# Patient Record
Sex: Male | Born: 1955 | Race: Black or African American | Hispanic: No | Marital: Single | Smoking: Never smoker
Health system: Southern US, Community
[De-identification: ages and names within clinical notes are randomized; demographics above are authoritative.]

## PROBLEM LIST (undated history)

## (undated) DIAGNOSIS — J309 Allergic rhinitis, unspecified: Secondary | ICD-10-CM

## (undated) HISTORY — PX: BACK SURGERY: SHX140

---

## 2015-10-01 ENCOUNTER — Ambulatory Visit
Admission: EM | Admit: 2015-10-01 | Discharge: 2015-10-01 | Disposition: A | Discharge status: Home / Self Care | Payer: Medicaid Other | Attending: Family Medicine | Admitting: Family Medicine

## 2015-10-01 ENCOUNTER — Ambulatory Visit: Payer: Medicaid Other

## 2015-10-01 DIAGNOSIS — S161XXA Strain of muscle, fascia and tendon at neck level, initial encounter: Secondary | ICD-10-CM

## 2015-10-01 DIAGNOSIS — Z79899 Other long term (current) drug therapy: Secondary | ICD-10-CM | POA: Insufficient documentation

## 2015-10-01 DIAGNOSIS — J309 Allergic rhinitis, unspecified: Secondary | ICD-10-CM | POA: Diagnosis not present

## 2015-10-01 DIAGNOSIS — M50322 Other cervical disc degeneration at C5-C6 level: Secondary | ICD-10-CM | POA: Diagnosis not present

## 2015-10-01 DIAGNOSIS — R202 Paresthesia of skin: Secondary | ICD-10-CM | POA: Diagnosis not present

## 2015-10-01 DIAGNOSIS — R200 Anesthesia of skin: Secondary | ICD-10-CM | POA: Insufficient documentation

## 2015-10-01 DIAGNOSIS — Z9889 Other specified postprocedural states: Secondary | ICD-10-CM | POA: Insufficient documentation

## 2015-10-01 HISTORY — DX: Allergic rhinitis, unspecified: J30.9

## 2015-10-01 MED ORDER — ORPHENADRINE CITRATE ER 100 MG PO TB12: 100.0000 mg | ORAL_TABLET | Freq: Two times a day (BID) | ORAL | 0 refills | Status: DC

## 2015-10-01 MED ORDER — MELOXICAM 15 MG PO TABS: 15.0000 mg | ORAL_TABLET | Freq: Every day | ORAL | 0 refills | Status: DC
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
## 2016-08-21 ENCOUNTER — Ambulatory Visit
Admission: EM | Admit: 2016-08-21 | Discharge: 2016-08-21 | Disposition: A | Discharge status: Home / Self Care | Payer: Medicaid Other | Attending: Emergency Medicine | Admitting: Emergency Medicine

## 2016-08-21 DIAGNOSIS — H6123 Impacted cerumen, bilateral: Secondary | ICD-10-CM

## 2016-08-21 MED ORDER — FLUTICASONE PROPIONATE 50 MCG/ACT NA SUSP: 2.0000 | Freq: Every day | NASAL | 0 refills | Status: AC

## 2016-08-21 NOTE — ED Provider Notes (Signed)
HPI  SUBJECTIVE:  Barry Parker is a 61 y.o. male who presents with diminished hearing in both of his ears for the past week. He states that he has been using Q-tips to clean his ears out. He tried peroxide without improvement in symptoms. No aggravating or alleviating factors. No pain, otorrhea, fevers, URI symptoms. He states that he feels that his nose feels congested but has not been using his Flonase. No sinus pain or pressure, ear pain, sore throat, postnasal drip, rhinorrhea. No allergy type symptoms. No recent swimming. has a history of "back problems". No history of diabetes. PMD: Dr. Redding:Medicine, [employer]   Past Medical History:  Diagnosis Date  . Allergic rhinitis     Past Surgical History:  Procedure Laterality Date  . BACK SURGERY      History reviewed. No pertinent family history.  Social History  Substance Use Topics  . Smoking status: Never Smoker  . Smokeless tobacco: Never Used  . Alcohol use Yes     Comment: social    No current facility-administered medications for this encounter.   Current Outpatient Prescriptions:  .  fluticasone (FLONASE) 50 MCG/ACT nasal spray, Place 2 sprays into both nostrils daily., Disp: 16 g, Rfl: 0  No Known Allergies   ROS  As noted in HPI.   Physical Exam  BP 107/87   Pulse (!) 56   Temp 97.7 F (36.5 C) (Oral)   Resp 18   Ht 5\' 10"  (1.778 m)   Wt 187 lb (84.8 kg)   SpO2 100%   BMI 26.83 kg/m   Constitutional: Well developed, well nourished, no acute distress Eyes:  EOMI, conjunctiva normal bilaterally HENT: Normocephalic, atraumatic,mucus membranes moist Swollen turbinates. Normal color. Mild nasal congestion. No sinus tenderness. Bilateral cerumen impaction. Hearing intact but diminished bilaterally. Respiratory: Normal inspiratory effort Cardiovascular: Normal rate GI: nondistended skin: No rash, skin intact Musculoskeletal: no deformities Neurologic: Alert & oriented x 3, no focal neuro  deficits Psychiatric: Speech and behavior appropriate   ED Course   Medications - No data to display  Orders Placed This Encounter  Procedures  . Marland KitchenEar Cerumen Removal    Both ears    Standing Status:   Standing    Number of Occurrences:   1    No results found for this or any previous visit (from the past 24 hour(s)). No results found.  ED Clinical Impression  Bilateral impacted cerumen   ED Assessment/Plan  We'll have appears irrigated and reevaluate. Also advised starting saline nasal irrigation and restarting Flonase.  Reevaluation, patient states that hearing has returned to normal. It is normal bilaterally when tested. EACs normal, TMs normal bilaterally.  Follow Up with PMD as needed. Plan as above.  Meds ordered this encounter  Medications  . fluticasone (FLONASE) 50 MCG/ACT nasal spray    Sig: Place 2 sprays into both nostrils daily.    Dispense:  16 g    Refill:  0    *This clinic note was created using Scientist, clinical (histocompatibility and immunogenetics). Therefore, there may be occasional mistakes despite careful proofreading.  ?   Domenick Gong, MD 08/21/16 1035

## 2016-08-21 NOTE — Discharge Instructions (Signed)
Debrox to help keep the wax soft and help prevent buildup, warm water and peroxide also as needed. Stop using Q-tips.  Saline nasal irrigation with a Lloyd Huger med sinus rinse or neti pot. Restart Flonase.

## 2016-08-21 NOTE — ED Triage Notes (Signed)
Patient complains of ear fullness in both ears x 1 week.
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
## 2019-04-04 ENCOUNTER — Ambulatory Visit: Payer: Medicaid Other | Attending: Internal Medicine

## 2019-04-04 DIAGNOSIS — Z23 Encounter for immunization: Secondary | ICD-10-CM

## 2019-04-04 NOTE — Progress Notes (Signed)
   Covid-19 Vaccination Clinic  Name:  Barry Parker    MRN: [id number] DOB: [birth year]  04/04/2019  Mr. Barry Parker was observed post Covid-19 immunization for 15 minutes without incident. He was provided with Vaccine Information Sheet and instruction to access the V-Safe system.   Mr. Barry Parker was instructed to call 911 with any severe reactions post vaccine: Marland Kitchen Difficulty breathing  . Swelling of face and throat  . A fast heartbeat  . A bad rash all over body  . Dizziness and weakness   Immunizations Administered    Name Date Dose VIS Date Route   Pfizer COVID-19 Vaccine 04/04/2019  9:17 AM 0.3 mL 12/14/2018 Intramuscular   Manufacturer: [employer], [employer]   Lot: [id number]   NDC: [address]-6

## 2019-04-24 ENCOUNTER — Ambulatory Visit: Payer: Self-pay

## 2019-04-30 ENCOUNTER — Ambulatory Visit: Payer: Medicaid Other | Attending: Internal Medicine

## 2019-04-30 DIAGNOSIS — Z23 Encounter for immunization: Secondary | ICD-10-CM

## 2019-04-30 NOTE — Progress Notes (Signed)
   Covid-19 Vaccination Clinic  Name:  Hanley Woerner    MRN: [id number] DOB: November 01, 1955  04/30/2019  Mr. Launer was observed post Covid-19 immunization for 15 minutes without incident. He was provided with Vaccine Information Sheet and instruction to access the V-Safe system.   Mr. Chalk was instructed to call 911 with any severe reactions post vaccine: Marland Kitchen Difficulty breathing  . Swelling of face and throat  . A fast heartbeat  . A bad rash all over body  . Dizziness and weakness   Immunizations Administered    Name Date Dose VIS Date Route   Pfizer COVID-19 Vaccine 04/30/2019  9:21 AM 0.3 mL 02/27/2018 Intramuscular   Manufacturer: [employer], [employer]   Lot: [id number]   NDC: [address]-4
